# Patient Record
Sex: Male | Born: 1995 | Race: Black or African American | Hispanic: No | Marital: Single | Smoking: Never smoker
Health system: Southern US, Community
[De-identification: ages and names within clinical notes are randomized; demographics above are authoritative.]

## PROBLEM LIST (undated history)

## (undated) DIAGNOSIS — J302 Other seasonal allergic rhinitis: Secondary | ICD-10-CM

## (undated) DIAGNOSIS — Z9109 Other allergy status, other than to drugs and biological substances: Secondary | ICD-10-CM

## (undated) DIAGNOSIS — J45909 Unspecified asthma, uncomplicated: Secondary | ICD-10-CM

## (undated) DIAGNOSIS — Z87442 Personal history of urinary calculi: Secondary | ICD-10-CM

## (undated) DIAGNOSIS — S82899A Other fracture of unspecified lower leg, initial encounter for closed fracture: Secondary | ICD-10-CM

## (undated) DIAGNOSIS — Z91010 Allergy to peanuts: Secondary | ICD-10-CM

## (undated) HISTORY — PX: OTHER SURGICAL HISTORY: SHX169

---
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
## 2014-11-06 ENCOUNTER — Ambulatory Visit (INDEPENDENT_AMBULATORY_CARE_PROVIDER_SITE_OTHER): Payer: Federal, State, Local not specified - PPO | Admitting: Podiatry

## 2014-11-06 ENCOUNTER — Encounter: Payer: Self-pay | Admitting: Podiatry

## 2014-11-06 VITALS — BP 114/65 | HR 65 | Ht 65.0 in | Wt 155.0 lb

## 2014-11-06 DIAGNOSIS — M722 Plantar fascial fibromatosis: Secondary | ICD-10-CM | POA: Insufficient documentation

## 2014-11-06 DIAGNOSIS — L600 Ingrowing nail: Secondary | ICD-10-CM

## 2014-11-06 DIAGNOSIS — M79674 Pain in right toe(s): Secondary | ICD-10-CM

## 2014-11-06 NOTE — Progress Notes (Signed)
SUBJECTIVE: 19 y.o. year old male presents complaining of ingrown nail on right great toe and pain in arch of both feet.  OBJECTIVE: DERMATOLOGIC EXAMINATION: Nails: Ingrown nail right great toe medial border without infection or inflammation.  VASCULAR EXAMINATION OF LOWER LIMBS: Pedal pulses: All pedal pulses are palpable with normal pulsation.  NEUROLOGIC EXAMINATION OF THE LOWER LIMBS: Achilles DTR is present and within normal. Sharp and Dull discriminatory sensations at the plantar ball of hallux is intact bilateral.  MUSCULOSKELETAL EXAMINATION: Positive for Hallux valgus with bunion bilateral. Positive of sagittal plane displacement of the first ray bilateral and fore foot varus bilateral. Tight Achilles tendon bilateral.   ASSESSMENT: Plantar fasciitis bilateral. 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 Ingrown nail right hallux medial border.   PLAN: Reviewed clinical findings and available treatment options. Offending border nail was debrided. Reviewed the need for custom orthotics. Patient will return for custom orthotics.

## 2014-11-06 NOTE — Patient Instructions (Signed)
Seen for painful ingrown nail. Debrided offending border.  Also has pain in arch. May benefit from custom orthotics. Return for custom orthotics.

## 2014-11-18 ENCOUNTER — Encounter: Payer: Self-pay | Admitting: Podiatry

## 2014-11-18 ENCOUNTER — Ambulatory Visit (INDEPENDENT_AMBULATORY_CARE_PROVIDER_SITE_OTHER): Payer: Federal, State, Local not specified - PPO | Admitting: Podiatry

## 2014-11-18 VITALS — BP 120/63 | HR 69 | Ht 65.0 in | Wt 154.0 lb

## 2014-11-18 DIAGNOSIS — M216X1 Other acquired deformities of right foot: Secondary | ICD-10-CM

## 2014-11-18 DIAGNOSIS — M216X2 Other acquired deformities of left foot: Secondary | ICD-10-CM

## 2014-11-18 DIAGNOSIS — M722 Plantar fascial fibromatosis: Secondary | ICD-10-CM

## 2014-11-18 NOTE — Patient Instructions (Signed)
Both feet casted for orthotics. 

## 2014-11-18 NOTE — Progress Notes (Signed)
SUBJECTIVE: 19 y.o. year old male presents to prepare for custom orthotics. Pain is in heel and in arch of both feet.  OBJECTIVE: DERMATOLOGIC EXAMINATION: No abnormal findings. VASCULAR EXAMINATION OF LOWER LIMBS: Pedal pulses: All pedal pulses are palpable with normal pulsation.  NEUROLOGIC EXAMINATION OF THE LOWER LIMBS: Achilles DTR is present and within normal. Sharp and Dull discriminatory sensations at the plantar ball of hallux is intact bilateral.  MUSCULOSKELETAL EXAMINATION: Positive for Hallux valgus with bunion bilateral. Positive of sagittal plane displacement of the first ray bilateral and fore foot varus bilateral. Tight Achilles tendon bilateral.  Radiographic examination reveal HAV with enlarged medial eminence of the first ray bilateral, short first ray left, positive of accessory Navicular bone in right, increased CC lateral deviation angle bilateral, anterior break in CYMA line bilateral.  ASSESSMENT: Plantar fasciitis bilateral. 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 Short first ray left. Excess STJ pronation bilateral.  PLAN: Reviewed clinical findings and available treatment options. Both feet casted for Orthotics.

## 2015-01-20 ENCOUNTER — Ambulatory Visit: Payer: Federal, State, Local not specified - PPO | Admitting: Podiatry

## 2015-05-08 DIAGNOSIS — N200 Calculus of kidney: Secondary | ICD-10-CM | POA: Insufficient documentation

## 2015-06-20 DIAGNOSIS — J309 Allergic rhinitis, unspecified: Secondary | ICD-10-CM

## 2015-06-20 DIAGNOSIS — J4530 Mild persistent asthma, uncomplicated: Secondary | ICD-10-CM

## 2015-07-21 ENCOUNTER — Encounter: Payer: Federal, State, Local not specified - PPO | Admitting: *Deleted

## 2015-07-22 ENCOUNTER — Ambulatory Visit (INDEPENDENT_AMBULATORY_CARE_PROVIDER_SITE_OTHER): Payer: Federal, State, Local not specified - PPO | Admitting: *Deleted

## 2015-07-22 DIAGNOSIS — J309 Allergic rhinitis, unspecified: Secondary | ICD-10-CM

## 2015-07-29 ENCOUNTER — Ambulatory Visit (INDEPENDENT_AMBULATORY_CARE_PROVIDER_SITE_OTHER): Payer: Federal, State, Local not specified - PPO

## 2015-07-29 DIAGNOSIS — J309 Allergic rhinitis, unspecified: Secondary | ICD-10-CM | POA: Diagnosis not present

## 2015-08-12 ENCOUNTER — Ambulatory Visit (INDEPENDENT_AMBULATORY_CARE_PROVIDER_SITE_OTHER): Payer: Federal, State, Local not specified - PPO

## 2015-08-12 DIAGNOSIS — J309 Allergic rhinitis, unspecified: Secondary | ICD-10-CM | POA: Diagnosis not present

## 2015-08-19 ENCOUNTER — Ambulatory Visit (INDEPENDENT_AMBULATORY_CARE_PROVIDER_SITE_OTHER): Payer: Federal, State, Local not specified - PPO | Admitting: *Deleted

## 2015-08-19 DIAGNOSIS — J309 Allergic rhinitis, unspecified: Secondary | ICD-10-CM

## 2015-09-01 ENCOUNTER — Ambulatory Visit (INDEPENDENT_AMBULATORY_CARE_PROVIDER_SITE_OTHER): Payer: Federal, State, Local not specified - PPO | Admitting: *Deleted

## 2015-09-01 DIAGNOSIS — J309 Allergic rhinitis, unspecified: Secondary | ICD-10-CM | POA: Diagnosis not present

## 2015-10-06 ENCOUNTER — Ambulatory Visit (INDEPENDENT_AMBULATORY_CARE_PROVIDER_SITE_OTHER): Payer: Federal, State, Local not specified - PPO | Admitting: *Deleted

## 2015-10-06 DIAGNOSIS — J309 Allergic rhinitis, unspecified: Secondary | ICD-10-CM | POA: Diagnosis not present

## 2015-10-28 ENCOUNTER — Ambulatory Visit (INDEPENDENT_AMBULATORY_CARE_PROVIDER_SITE_OTHER): Payer: Federal, State, Local not specified - PPO

## 2015-10-28 DIAGNOSIS — J309 Allergic rhinitis, unspecified: Secondary | ICD-10-CM

## 2015-11-13 DIAGNOSIS — J301 Allergic rhinitis due to pollen: Secondary | ICD-10-CM | POA: Diagnosis not present

## 2015-11-14 DIAGNOSIS — J3089 Other allergic rhinitis: Secondary | ICD-10-CM | POA: Diagnosis not present

## 2015-11-18 ENCOUNTER — Encounter: Payer: Self-pay | Admitting: Pediatrics

## 2015-11-18 ENCOUNTER — Ambulatory Visit (INDEPENDENT_AMBULATORY_CARE_PROVIDER_SITE_OTHER): Payer: 59 | Admitting: Pediatrics

## 2015-11-18 VITALS — BP 104/70 | HR 84 | Temp 98.9°F | Resp 16 | Ht 64.57 in | Wt 153.2 lb

## 2015-11-18 DIAGNOSIS — J4521 Mild intermittent asthma with (acute) exacerbation: Secondary | ICD-10-CM | POA: Diagnosis not present

## 2015-11-18 DIAGNOSIS — J45901 Unspecified asthma with (acute) exacerbation: Secondary | ICD-10-CM | POA: Insufficient documentation

## 2015-11-18 DIAGNOSIS — J301 Allergic rhinitis due to pollen: Secondary | ICD-10-CM | POA: Diagnosis not present

## 2015-11-18 LAB — PULMONARY FUNCTION TEST

## 2015-11-18 MED ORDER — FLUTICASONE PROPIONATE 50 MCG/ACT NA SUSP: NASAL | Status: DC

## 2015-11-18 MED ORDER — ALBUTEROL SULFATE HFA 108 (90 BASE) MCG/ACT IN AERS: INHALATION_SPRAY | RESPIRATORY_TRACT | Status: DC

## 2015-11-18 MED ORDER — EPINEPHRINE 0.3 MG/0.3ML IJ SOAJ: INTRAMUSCULAR | Status: DC

## 2015-11-18 NOTE — Patient Instructions (Signed)
Continue on his current medications Add prednisone 20 mg twice a day for 3 days, 20 mg on the fourth day, 10 mg on the fifth day Call me if he is not doing well on this treatment plan

## 2015-11-18 NOTE — Progress Notes (Signed)
  [address] [place] [address] Dept: [phone]  FOLLOW UP NOTE  Patient ID: Andrew Jackson, male    DOB: Sep 20, 1996  Age: 20 y.o. MRN: [id number] Date of Office Visit: 11/18/2015  Assessment Chief Complaint: Cough and Breathing Problem  HPI Andrew Jackson presents for evaluation of coughing and some wheezing. He had a cold 2 or 3 weeks ago and developed some coughing and wheezing. He continues to cough. Prior to this time his asthma has been very well controlled He is on allergy injections every 4 weeks. 1 vial contains grass and tree pollens and the other vial contains weeds ,molds and dust mite  Current medications are pro-air 2 puffs every 4 hours if needed, fluticasone 2 sprays per nostril once a day if needed and Zyrtec 10 mg once a day if needed   Drug Allergies:  No Known Allergies  Physical Exam: BP 104/70 mmHg  Pulse 84  Temp(Src) 98.9 F (37.2 C) (Oral)  Resp 16  Ht 5' 4.57" (1.64 m)  Wt 153 lb 3.2 oz (69.491 kg)  BMI 25.84 kg/m2   Physical Exam  Constitutional: He is oriented to person, place, and time. He appears well-developed and well-nourished.  HENT:  Eyes normal. Ears normal. Nose normal. Pharynx normal.  Neck: Neck supple.  Cardiovascular:  S1 and S2 normal no murmurs  Pulmonary/Chest:  Clear to percussion and auscultation  Lymphadenopathy:    He has no cervical adenopathy.  Neurological: He is alert and oriented to person, place, and time.  Psychiatric: He has a normal mood and affect. His behavior is normal. Judgment and thought content normal.  Vitals reviewed.   Diagnostics:  FVC 4.31 L FEV1 3.20 L. Predicted FVC 4.00 L predicted FEV1 3.45 L-the spirometry shows a mild reduction in the FEV1 percent  Assessment and Plan: 1. Asthma with acute exacerbation, mild intermittent   2. Allergic rhinitis due to pollen     Meds ordered this encounter  Medications  . EPINEPHrine (EPIPEN 2-PAK) 0.3 mg/0.3 mL IJ SOAJ injection    Sig: USE  AS DIRECTED FOR SEVERE ALLERGIC REACTION.    Dispense:  2 Device    Refill:  2  . albuterol (PROVENTIL HFA;VENTOLIN HFA) 108 (90 Base) MCG/ACT inhaler    Sig: TWO PUFFS EVERY 4 HOURS IF NEEDED FOR COUGH OR WHEEZE.    Dispense:  8 g    Refill:  2  . fluticasone (FLONASE) 50 MCG/ACT nasal spray    Sig: TWO SPRAYS EACH NOSTRIL ONCE A DAY FOR NASAL CONGESTION OR DRAINAGE.    Dispense:  16 g    Refill:  5    Patient Instructions  Continue on his current medications Add prednisone 20 mg twice a day for 3 days, 20 mg on the fourth day, 10 mg on the fifth day Call me if he is not doing well on this treatment plan    Return in about 1 year (around 11/17/2016).    Thank you for the opportunity to care for this patient.  Please do not hesitate to contact me with questions.  Tonette Bihari, M.D.  [facility] [address] [place], [place] [address] [phone]

## 2015-12-04 ENCOUNTER — Ambulatory Visit: Payer: Federal, State, Local not specified - PPO | Admitting: Pediatrics

## 2015-12-23 ENCOUNTER — Ambulatory Visit (INDEPENDENT_AMBULATORY_CARE_PROVIDER_SITE_OTHER): Payer: 59

## 2015-12-23 DIAGNOSIS — J309 Allergic rhinitis, unspecified: Secondary | ICD-10-CM

## 2016-01-15 ENCOUNTER — Ambulatory Visit (INDEPENDENT_AMBULATORY_CARE_PROVIDER_SITE_OTHER): Payer: 59 | Admitting: *Deleted

## 2016-01-15 DIAGNOSIS — J309 Allergic rhinitis, unspecified: Secondary | ICD-10-CM | POA: Diagnosis not present

## 2016-03-10 ENCOUNTER — Ambulatory Visit (INDEPENDENT_AMBULATORY_CARE_PROVIDER_SITE_OTHER): Payer: 59

## 2016-03-10 DIAGNOSIS — J309 Allergic rhinitis, unspecified: Secondary | ICD-10-CM | POA: Diagnosis not present

## 2016-03-25 ENCOUNTER — Ambulatory Visit (INDEPENDENT_AMBULATORY_CARE_PROVIDER_SITE_OTHER): Payer: 59 | Admitting: *Deleted

## 2016-03-25 DIAGNOSIS — J309 Allergic rhinitis, unspecified: Secondary | ICD-10-CM | POA: Diagnosis not present

## 2016-03-30 ENCOUNTER — Ambulatory Visit (INDEPENDENT_AMBULATORY_CARE_PROVIDER_SITE_OTHER): Payer: 59

## 2016-03-30 DIAGNOSIS — J309 Allergic rhinitis, unspecified: Secondary | ICD-10-CM

## 2016-04-08 ENCOUNTER — Ambulatory Visit (INDEPENDENT_AMBULATORY_CARE_PROVIDER_SITE_OTHER): Payer: 59 | Admitting: *Deleted

## 2016-04-08 DIAGNOSIS — J309 Allergic rhinitis, unspecified: Secondary | ICD-10-CM

## 2016-05-03 ENCOUNTER — Ambulatory Visit (INDEPENDENT_AMBULATORY_CARE_PROVIDER_SITE_OTHER): Payer: 59

## 2016-05-03 DIAGNOSIS — J309 Allergic rhinitis, unspecified: Secondary | ICD-10-CM | POA: Diagnosis not present

## 2016-05-20 ENCOUNTER — Ambulatory Visit (INDEPENDENT_AMBULATORY_CARE_PROVIDER_SITE_OTHER): Payer: 59

## 2016-05-20 DIAGNOSIS — J309 Allergic rhinitis, unspecified: Secondary | ICD-10-CM | POA: Diagnosis not present

## 2016-11-09 NOTE — Addendum Note (Signed)
Addended by: Berna BueWHITAKER, CARRIE L on: 11/09/2016 08:57 AM   Modules accepted: Orders

## 2018-02-14 ENCOUNTER — Ambulatory Visit (INDEPENDENT_AMBULATORY_CARE_PROVIDER_SITE_OTHER): Payer: 59 | Admitting: Pediatrics

## 2018-02-14 ENCOUNTER — Encounter: Payer: Self-pay | Admitting: Pediatrics

## 2018-02-14 VITALS — BP 122/80 | HR 70 | Temp 98.0°F | Resp 16 | Ht 64.25 in | Wt 183.0 lb

## 2018-02-14 DIAGNOSIS — J4530 Mild persistent asthma, uncomplicated: Secondary | ICD-10-CM

## 2018-02-14 DIAGNOSIS — H1010 Acute atopic conjunctivitis, unspecified eye: Secondary | ICD-10-CM

## 2018-02-14 DIAGNOSIS — J301 Allergic rhinitis due to pollen: Secondary | ICD-10-CM | POA: Diagnosis not present

## 2018-02-14 MED ORDER — ALBUTEROL SULFATE HFA 108 (90 BASE) MCG/ACT IN AERS: 2.0000 | INHALATION_SPRAY | RESPIRATORY_TRACT | 2 refills | Status: AC | PRN

## 2018-02-14 MED ORDER — FLUTICASONE PROPIONATE 50 MCG/ACT NA SUSP: NASAL | 5 refills | Status: DC

## 2018-02-14 MED ORDER — MONTELUKAST SODIUM 10 MG PO TABS: ORAL_TABLET | ORAL | 5 refills | Status: DC

## 2018-02-14 NOTE — Progress Notes (Signed)
  [address] [place] [address] Dept: [phone]  FOLLOW UP NOTE  Patient ID: Andrew Jackson, male    DOB: October 07, 1996  Age: 22 y.o. MRN: [id number] Date of Office Visit: 02/14/2018  Assessment  Chief Complaint: Asthma  HPI Andrew Jackson presents for Treatment of coughing, shortness of breath, nasal congestion and sneezing. His last visit was in January 2017. He used to be on allergy injections to grass pollens and tree pollens in one vial and the other vial to weeds and molds and dust mite. He stopped his allergy injections about 1-1/2 years ago  Current medications - cetirizine 10 mg once a day    Drug Allergies:  No Known Allergies  Physical Exam: BP 122/80   Pulse 70   Temp 98 F (36.7 C) (Oral)   Resp 16   Ht 5' 4.25" (1.632 m)   Wt 183 lb (83 kg)   SpO2 97%   BMI 31.17 kg/m    Physical Exam  Constitutional: He is oriented to person, place, and time. He appears well-developed and well-nourished.  HENT:  Eyes normal. Ears normal. Nose moderate swelling of nasal turbinates with clear nasal discharge. Pharynx normal.  Neck: Neck supple.  Cardiovascular:  S1 and S2 normal no murmurs  Pulmonary/Chest:  Clear to percussion and auscultation  Lymphadenopathy:    He has no cervical adenopathy.  Neurological: He is alert and oriented to person, place, and time.  Vitals reviewed.   Diagnostics:  FVC 4.09 L FEV1 3.21 L. Predicted FVC 3.54 L predicted FEV1 2.73 L-the spirometry is in the normal range  Assessment and Plan: 1. Mild persistent asthma without complication   2. Seasonal allergic rhinitis due to pollen   3. Seasonal allergic conjunctivitis     Meds ordered this encounter  Medications  . fluticasone (FLONASE) 50 MCG/ACT nasal spray    Sig: Use 2 sprays per nostril once a day for stuffy nose    Dispense:  16 g    Refill:  5  . montelukast (SINGULAIR) 10 MG tablet    Sig: Take 1 tablet once a day to prevent coughing or wheezing    Dispense:   30 tablet    Refill:  5  . albuterol (PROAIR HFA) 108 (90 Base) MCG/ACT inhaler    Sig: Inhale 2 puffs into the lungs every 4 (four) hours as needed for wheezing or shortness of breath.    Dispense:  1 Inhaler    Refill:  2    Patient Instructions  Zyrtec 10 mg-take 1 tablet once a day for runny nose or itchy eyes Fluticasone 2 sprays per nostril once a day for stuffy nose Opcon-A-one drop 3 times a day if needed for itchy eyes Montelukast 10 mg-take 1 tablet once a day to prevent coughing or wheezing Pro-air 2 puffs every 4 hours if needed for wheezing or coughing spells Prednisone 20 mg twice a day for 3 days, 20 mg on the fourth day, 10 mg on the fifth day to bring your allergic symptoms under control Call us if you're not doing well on this treatment plan   Return in about 6 weeks (around 03/28/2018).    Thank you for the opportunity to care for this patient.  Please do not hesitate to contact me with questions.  Tonette Bihari, M.D.  [facility] [address] [place], [place] [address] [phone]

## 2018-02-14 NOTE — Patient Instructions (Signed)
Zyrtec 10 mg-take 1 tablet once a day for runny nose or itchy eyes Fluticasone 2 sprays per nostril once a day for stuffy nose Opcon-A-one drop 3 times a day if needed for itchy eyes Montelukast 10 mg-take 1 tablet once a day to prevent coughing or wheezing Pro-air 2 puffs every 4 hours if needed for wheezing or coughing spells Prednisone 20 mg twice a day for 3 days, 20 mg on the fourth day, 10 mg on the fifth day to bring your allergic symptoms under control Call us if you're not doing well on this treatment plan

## 2020-08-31 ENCOUNTER — Encounter (HOSPITAL_BASED_OUTPATIENT_CLINIC_OR_DEPARTMENT_OTHER): Payer: Self-pay | Admitting: Emergency Medicine

## 2020-08-31 ENCOUNTER — Emergency Department (HOSPITAL_BASED_OUTPATIENT_CLINIC_OR_DEPARTMENT_OTHER)
Admission: EM | Admit: 2020-08-31 | Discharge: 2020-08-31 | Disposition: A | Discharge status: Home / Self Care | Payer: BC Managed Care – PPO | Attending: Emergency Medicine | Admitting: Emergency Medicine

## 2020-08-31 ENCOUNTER — Other Ambulatory Visit: Payer: Self-pay

## 2020-08-31 ENCOUNTER — Emergency Department (HOSPITAL_BASED_OUTPATIENT_CLINIC_OR_DEPARTMENT_OTHER): Payer: BC Managed Care – PPO

## 2020-08-31 DIAGNOSIS — S46211A Strain of muscle, fascia and tendon of other parts of biceps, right arm, initial encounter: Secondary | ICD-10-CM | POA: Diagnosis not present

## 2020-08-31 DIAGNOSIS — S46219A Strain of muscle, fascia and tendon of other parts of biceps, unspecified arm, initial encounter: Secondary | ICD-10-CM

## 2020-08-31 DIAGNOSIS — Y9367 Activity, basketball: Secondary | ICD-10-CM | POA: Diagnosis not present

## 2020-08-31 DIAGNOSIS — X501XXA Overexertion from prolonged static or awkward postures, initial encounter: Secondary | ICD-10-CM | POA: Diagnosis not present

## 2020-08-31 DIAGNOSIS — Z91010 Allergy to peanuts: Secondary | ICD-10-CM | POA: Insufficient documentation

## 2020-08-31 DIAGNOSIS — S4991XA Unspecified injury of right shoulder and upper arm, initial encounter: Secondary | ICD-10-CM | POA: Diagnosis present

## 2020-08-31 DIAGNOSIS — J45909 Unspecified asthma, uncomplicated: Secondary | ICD-10-CM | POA: Insufficient documentation

## 2020-08-31 HISTORY — DX: Strain of muscle, fascia and tendon of other parts of biceps, unspecified arm, initial encounter: S46.219A

## 2020-08-31 MED ORDER — NAPROXEN 250 MG PO TABS
500.0000 mg | ORAL_TABLET | Freq: Once | ORAL | Status: AC
  Administered 2020-08-31: 500 mg via ORAL
  Filled 2020-08-31: qty 2

## 2020-08-31 NOTE — Discharge Instructions (Addendum)
You were evaluated in the Emergency Department and after careful evaluation, we did not find any emergent condition requiring admission or further testing in the hospital.  Your exam/testing today is overall reassuring.  Your x-ray does not show any broken bones.  Your exam is suspicious for injury or rupture of your distal biceps tendon.  We recommend close follow-up with the orthopedic specialists for further management.  We recommend Tylenol or Motrin at home for discomfort.  We are providing a sling for comfort but please remove the sling and rotate your shoulder multiple times per day to prevent your shoulder from freezing up.  Please return to the Emergency Department if you experience any worsening of your condition.   Thank you for allowing [name] to be a part of your care.

## 2020-08-31 NOTE — ED Triage Notes (Signed)
R arm injury today while playing basketball. C/o pain to the bicep.

## 2020-08-31 NOTE — ED Provider Notes (Signed)
[facility]PT [facility] Emergency Department Provider Note MRN:  [id number]  Arrival date & time: 08/31/20     Chief Complaint   Arm Injury   History of Present Illness   Andrew Jackson is a 24 y.o. year-old male with no pertinent past medical presenting to the ED with chief complaint of arm injury.  Patient explains that he was playing basketball and he was defending the person with the ball, who tried to quickly move past him to his right.  He attempted to grab the ball with his right arm but the player and then a second player kept running through his arm.  He tried with all his my to hold onto the ball and then he felt a sudden severe pain at his right antecubital fossa.  Denies any other injuries.  Pain is worse with motion and palpation to this area.  Review of Systems  A problem-focused ROS was performed. Positive for arm injury.  Patient denies head trauma.  Patient's Health History    Past Medical History:  Diagnosis Date  . Asthma   . Environmental allergies   . Fx ankle LEFT  . Peanut allergy     Past Surgical History:  Procedure Laterality Date  . HARDWARE REMOVAL  10/29/2011   Procedure: HARDWARE REMOVAL;  Surgeon: Kathryne Hitch;  Location: [facility];  Service: Orthopedics;  Laterality: Left;  . SCREWS L ANKLE      Family History  Problem Relation Age of Onset  . Asthma Son     Social History   Socioeconomic History  . Marital status: Single    Spouse name: Not on file  . Number of children: Not on file  . Years of education: Not on file  . Highest education level: Not on file  Occupational History  . Not on file  Tobacco Use  . Smoking status: Never Smoker  . Smokeless tobacco: Never Used  Vaping Use  . Vaping Use: Never used  Substance and Sexual Activity  . Alcohol use: No    Alcohol/week: 0.0 standard drinks  . Drug use: No  . Sexual activity: Yes  Other Topics Concern  . Not on file  Social History Narrative  . Not  on file   Social Determinants of Health   Financial Resource Strain:   . Difficulty of Paying Living Expenses: Not on file  Food Insecurity:   . Worried About Programme researcher, broadcasting/film/video in the Last Year: Not on file  . Ran Out of Food in the Last Year: Not on file  Transportation Needs:   . Lack of Transportation (Medical): Not on file  . Lack of Transportation (Non-Medical): Not on file  Physical Activity:   . Days of Exercise per Week: Not on file  . Minutes of Exercise per Session: Not on file  Stress:   . Feeling of Stress : Not on file  Social Connections:   . Frequency of Communication with Friends and Family: Not on file  . Frequency of Social Gatherings with Friends and Family: Not on file  . Attends Religious Services: Not on file  . Active Member of Clubs or Organizations: Not on file  . Attends Banker Meetings: Not on file  . Marital Status: Not on file  Intimate Partner Violence:   . Fear of Current or Ex-Partner: Not on file  . Emotionally Abused: Not on file  . Physically Abused: Not on file  . Sexually Abused: Not on file  Physical Exam   Vitals:   08/31/20 1633  BP: 114/82  Pulse: 66  Resp: 16  Temp: 98.3 F (36.8 C)  SpO2: 100%    CONSTITUTIONAL: Well-appearing, NAD NEURO:  Alert and oriented x 3, no focal deficits EYES:  eyes equal and reactive ENT/NECK:  no LAD, no JVD CARDIO: Regular rate, well-perfused, normal S1 and S2 PULM:  CTAB no wheezing or rhonchi GI/GU:  normal bowel sounds, non-distended, non-tender MSK/SPINE:  No gross deformities, no edema, tenderness to palpation to the right antecubital fossa, unable to palpate the distal biceps tendon SKIN:  no rash, atraumatic PSYCH:  Appropriate speech and behavior  *Additional and/or pertinent findings included in MDM below  Diagnostic and Interventional Summary    EKG Interpretation  Date/Time:    Ventricular Rate:    PR Interval:    QRS Duration:   QT Interval:    QTC  Calculation:   R Axis:     Text Interpretation:        Labs Reviewed - No data to display  DG Elbow Complete Right  Final Result      Medications  naproxen (NAPROSYN) tablet 500 mg (500 mg Oral Given 08/31/20 1740)     Procedures  /  Critical Care Procedures  ED Course and Medical Decision Making  I have reviewed the triage vital signs, the nursing notes, and pertinent available records from the EMR.  Listed above are laboratory and imaging tests that I personally ordered, reviewed, and interpreted and then considered in my medical decision making (see below for details).  Suspicion for injury or rupture of the right distal biceps tendon on exam.  He does have full range of motion but seems to be having some issues with supination.  X-rays reassuring, neurovascularly intact, referred to orthopedics for further management.  Patient made aware of the importance of follow-up as he may need surgery to regain full function.       Elmer Sow. Pilar Plate, MD [facility] Emergency Medicine [facility] [email or web address]  Final Clinical Impressions(s) / ED Diagnoses     ICD-10-CM   1. Rupture of right distal biceps tendon, initial encounter  P10.258N     ED Discharge Orders    None       Discharge Instructions Discussed with and Provided to Patient:     Discharge Instructions     You were evaluated in the Emergency Department and after careful evaluation, we did not find any emergent condition requiring admission or further testing in the hospital.  Your exam/testing today is overall reassuring.  Your x-ray does not show any broken bones.  Your exam is suspicious for injury or rupture of your distal biceps tendon.  We recommend close follow-up with the orthopedic specialists for further management.  We recommend Tylenol or Motrin at home for discomfort.  We are providing a sling for comfort but please remove the sling and rotate your shoulder multiple times per day  to prevent your shoulder from freezing up.  Please return to the Emergency Department if you experience any worsening of your condition.   Thank you for allowing [name] to be a part of your care.      Sabas Sous, MD 08/31/20 [phone]

## 2020-09-05 ENCOUNTER — Encounter (HOSPITAL_BASED_OUTPATIENT_CLINIC_OR_DEPARTMENT_OTHER): Payer: Self-pay | Admitting: Orthopedic Surgery

## 2020-09-05 ENCOUNTER — Other Ambulatory Visit: Payer: Self-pay

## 2020-09-05 ENCOUNTER — Other Ambulatory Visit (HOSPITAL_COMMUNITY)
Admission: RE | Admit: 2020-09-05 | Discharge: 2020-09-05 | Disposition: A | Discharge status: Home / Self Care | Payer: BC Managed Care – PPO | Source: Ambulatory Visit | Attending: Orthopedic Surgery | Admitting: Orthopedic Surgery

## 2020-09-05 DIAGNOSIS — Z20822 Contact with and (suspected) exposure to covid-19: Secondary | ICD-10-CM | POA: Diagnosis not present

## 2020-09-05 DIAGNOSIS — Z01812 Encounter for preprocedural laboratory examination: Secondary | ICD-10-CM | POA: Diagnosis not present

## 2020-09-05 LAB — SARS CORONAVIRUS 2 (TAT 6-24 HRS): SARS Coronavirus 2: NEGATIVE

## 2020-09-05 NOTE — Progress Notes (Signed)
Spoke w/ via phone for pre-op interview--- PT Lab needs dos---- no              Lab results------ no COVID test ------ done today 09-05-2020 Arrive at ------- 1030 NPO after MN NO Solid Food.  Clear liquids from MN until--- 0930 Medications to take morning of surgery ----- NONE Diabetic medication ----- n/a Patient Special Instructions ----- n/a Pre-Op special Istructions ----- n/a Patient verbalized understanding of instructions that were given at this phone interview. Patient denies shortness of breath, chest pain, fever, cough at this phone interview.

## 2020-09-08 NOTE — H&P (Signed)
HPI:   Andrew Jackson is a 24 year-old seen for an acute injury to his right elbow that occurred two days ago when he was playing basketball and had it hyperextended when reaching out for a ball.  Significant pain and swelling in the right elbow since then.  He was seen at [facility] where x-rays were negative for a fracture.  Past Medical History:  Diagnosis Date  . Environmental allergies   . History of kidney stones   . Mild asthma   . Seasonal allergic rhinitis   . Tear of distal tendon of biceps 08/31/2020   right     Past Surgical History:  Procedure Laterality Date  . HARDWARE REMOVAL  10/29/2011   Procedure: HARDWARE REMOVAL;  Surgeon: Kathryne Hitch;  Location: [facility];  Service: Orthopedics;  Laterality: Left;  . ORIF ANKLE FRACTURE Left 07/2011  . WISDOM TOOTH EXTRACTION  2009     Family History  Problem Relation Age of Onset  . Asthma Son      Current Medications No current facility-administered medications for this encounter.   Marland Kitchen albuterol (PROAIR HFA) 108 (90 Base) MCG/ACT inhaler     Allergies Peanut-containing drug products  Social History  reports that he has never smoked. He has never used smokeless tobacco. He reports that he does not drink alcohol and does not use drugs.    Physical Exam General: Well appearing, in NAD Mental status: Alert and Oriented x3 Lungs: CTA b/l anterior and posterior without crackles or wheeze Heart: RRR, no m/g/r appreciated Abdomen: +BS, soft, nt, nd, no masses, hernias, or organomegaly appreciated Neurological: Speech Clear and organized, no gross focal findings or movement disorder appreciated Musculoskeletal: no gross joint deformity or swelling.  Observed gait normal Extremities:RUE: Examination of his right elbow reveals pain at the distal biceps.  Pain on flexion and supination with weakness.  Mild pain on the ulnar collateral ligament.  Range of motion is 0-130 degrees.  Questionable 1+ valgus  instability with only mild pain.  More of his pain is in the distal biceps region.   Other extremities- Warm and well perfused w/o edema Skin: Warm and dry    Imaging X-rays are reviewed from [facility] that showed no bony or joint abnormalities.   A/P Rupture of the right distal biceps tendon  Will plan for operative repair of the biceps tendon. The risks/benifts/alternatives were discussed with the pt. Who has elected to proceed with the plan.

## 2020-09-09 ENCOUNTER — Encounter (HOSPITAL_BASED_OUTPATIENT_CLINIC_OR_DEPARTMENT_OTHER)
Admission: RE | Disposition: A | Discharge status: Home / Self Care | Payer: Self-pay | Source: Home / Self Care | Attending: Orthopedic Surgery

## 2020-09-09 ENCOUNTER — Ambulatory Visit (HOSPITAL_BASED_OUTPATIENT_CLINIC_OR_DEPARTMENT_OTHER)
Admission: RE | Admit: 2020-09-09 | Discharge: 2020-09-09 | Disposition: A | Discharge status: Home / Self Care | Payer: BC Managed Care – PPO | Attending: Orthopedic Surgery | Admitting: Orthopedic Surgery

## 2020-09-09 ENCOUNTER — Ambulatory Visit (HOSPITAL_BASED_OUTPATIENT_CLINIC_OR_DEPARTMENT_OTHER): Payer: BC Managed Care – PPO | Admitting: Anesthesiology

## 2020-09-09 ENCOUNTER — Encounter (HOSPITAL_BASED_OUTPATIENT_CLINIC_OR_DEPARTMENT_OTHER): Payer: Self-pay | Admitting: Orthopedic Surgery

## 2020-09-09 DIAGNOSIS — S46211A Strain of muscle, fascia and tendon of other parts of biceps, right arm, initial encounter: Secondary | ICD-10-CM | POA: Diagnosis not present

## 2020-09-09 DIAGNOSIS — X501XXA Overexertion from prolonged static or awkward postures, initial encounter: Secondary | ICD-10-CM | POA: Insufficient documentation

## 2020-09-09 DIAGNOSIS — Y9367 Activity, basketball: Secondary | ICD-10-CM | POA: Diagnosis not present

## 2020-09-09 HISTORY — PX: DISTAL BICEPS TENDON REPAIR: SHX1461

## 2020-09-09 HISTORY — DX: Personal history of urinary calculi: Z87.442

## 2020-09-09 HISTORY — DX: Unspecified asthma, uncomplicated: J45.909

## 2020-09-09 HISTORY — DX: Other seasonal allergic rhinitis: J30.2

## 2020-09-09 SURGERY — REPAIR, TENDON, BICEPS, DISTAL
Anesthesia: Regional | Laterality: Right

## 2020-09-09 MED ORDER — OXYCODONE HCL 5 MG/5ML PO SOLN: 5.0000 mg | Freq: Once | ORAL | Status: AC | PRN

## 2020-09-09 MED ORDER — MIDAZOLAM HCL 2 MG/2ML IJ SOLN
INTRAMUSCULAR | Status: AC
  Filled 2020-09-09: qty 2

## 2020-09-09 MED ORDER — METOCLOPRAMIDE HCL 5 MG/ML IJ SOLN: 5.0000 mg | Freq: Three times a day (TID) | INTRAMUSCULAR | Status: DC | PRN

## 2020-09-09 MED ORDER — ONDANSETRON HCL 4 MG PO TABS
4.0000 mg | ORAL_TABLET | Freq: Four times a day (QID) | ORAL | Status: DC | PRN
Start: 2020-09-09 — End: 2020-09-09

## 2020-09-09 MED ORDER — CEFAZOLIN SODIUM-DEXTROSE 2-4 GM/100ML-% IV SOLN
INTRAVENOUS | Status: AC
  Filled 2020-09-09: qty 100

## 2020-09-09 MED ORDER — FENTANYL CITRATE (PF) 100 MCG/2ML IJ SOLN
INTRAMUSCULAR | Status: AC
  Filled 2020-09-09: qty 2

## 2020-09-09 MED ORDER — OXYCODONE HCL 5 MG PO TABS
5.0000 mg | ORAL_TABLET | Freq: Once | ORAL | Status: AC | PRN
  Administered 2020-09-09: 5 mg via ORAL

## 2020-09-09 MED ORDER — PROPOFOL 10 MG/ML IV BOLUS
INTRAVENOUS | Status: DC | PRN
  Administered 2020-09-09: 200 mg via INTRAVENOUS

## 2020-09-09 MED ORDER — ONDANSETRON HCL 4 MG/2ML IJ SOLN
INTRAMUSCULAR | Status: DC | PRN
  Administered 2020-09-09: 4 mg via INTRAVENOUS

## 2020-09-09 MED ORDER — MIDAZOLAM HCL 2 MG/2ML IJ SOLN
2.0000 mg | Freq: Once | INTRAMUSCULAR | Status: AC
  Administered 2020-09-09: 2 mg via INTRAVENOUS

## 2020-09-09 MED ORDER — MORPHINE SULFATE (PF) 4 MG/ML IV SOLN: 0.5000 mg | INTRAVENOUS | Status: DC | PRN

## 2020-09-09 MED ORDER — ONDANSETRON HCL 4 MG/2ML IJ SOLN
INTRAMUSCULAR | Status: AC
  Filled 2020-09-09: qty 2

## 2020-09-09 MED ORDER — ACETAMINOPHEN 500 MG PO TABS
ORAL_TABLET | ORAL | Status: AC
  Filled 2020-09-09: qty 2

## 2020-09-09 MED ORDER — HYDROCODONE-ACETAMINOPHEN 5-325 MG PO TABS: 1.0000 | ORAL_TABLET | ORAL | 0 refills | Status: AC | PRN

## 2020-09-09 MED ORDER — IBUPROFEN 800 MG PO TABS: 800.0000 mg | ORAL_TABLET | Freq: Three times a day (TID) | ORAL | 0 refills | Status: AC | PRN

## 2020-09-09 MED ORDER — PROMETHAZINE HCL 25 MG/ML IJ SOLN: 6.2500 mg | INTRAMUSCULAR | Status: DC | PRN

## 2020-09-09 MED ORDER — CEFAZOLIN SODIUM-DEXTROSE 2-4 GM/100ML-% IV SOLN
2.0000 g | INTRAVENOUS | Status: AC
Start: 2020-09-09 — End: 2020-09-09
  Administered 2020-09-09: 2 g via INTRAVENOUS

## 2020-09-09 MED ORDER — SENNOSIDES-DOCUSATE SODIUM 8.6-50 MG PO TABS
1.0000 | ORAL_TABLET | Freq: Every evening | ORAL | Status: DC | PRN
  Filled 2020-09-09: qty 1

## 2020-09-09 MED ORDER — DEXAMETHASONE SODIUM PHOSPHATE 10 MG/ML IJ SOLN
INTRAMUSCULAR | Status: DC | PRN
  Administered 2020-09-09: 10 mg via INTRAVENOUS

## 2020-09-09 MED ORDER — NAPROXEN 250 MG PO TABS
250.0000 mg | ORAL_TABLET | Freq: Two times a day (BID) | ORAL | Status: DC
  Filled 2020-09-09: qty 1

## 2020-09-09 MED ORDER — ONDANSETRON HCL 4 MG/2ML IJ SOLN: 4.0000 mg | Freq: Four times a day (QID) | INTRAMUSCULAR | Status: DC | PRN

## 2020-09-09 MED ORDER — HYDROCODONE-ACETAMINOPHEN 7.5-325 MG PO TABS: 1.0000 | ORAL_TABLET | ORAL | Status: DC | PRN

## 2020-09-09 MED ORDER — CEFAZOLIN SODIUM-DEXTROSE 2-4 GM/100ML-% IV SOLN: 2.0000 g | Freq: Four times a day (QID) | INTRAVENOUS | Status: DC

## 2020-09-09 MED ORDER — MIDAZOLAM HCL 5 MG/5ML IJ SOLN
INTRAMUSCULAR | Status: DC | PRN
  Administered 2020-09-09: 2 mg via INTRAVENOUS

## 2020-09-09 MED ORDER — LIDOCAINE HCL (PF) 2 % IJ SOLN
INTRAMUSCULAR | Status: AC
  Filled 2020-09-09: qty 5

## 2020-09-09 MED ORDER — KETOROLAC TROMETHAMINE 30 MG/ML IJ SOLN
30.0000 mg | Freq: Once | INTRAMUSCULAR | Status: AC | PRN
  Administered 2020-09-09: 30 mg via INTRAVENOUS

## 2020-09-09 MED ORDER — HYDROCODONE-ACETAMINOPHEN 5-325 MG PO TABS: 1.0000 | ORAL_TABLET | ORAL | Status: DC | PRN

## 2020-09-09 MED ORDER — DEXAMETHASONE SODIUM PHOSPHATE 10 MG/ML IJ SOLN
INTRAMUSCULAR | Status: AC
  Filled 2020-09-09: qty 1

## 2020-09-09 MED ORDER — LIDOCAINE 2% (20 MG/ML) 5 ML SYRINGE
INTRAMUSCULAR | Status: DC | PRN
  Administered 2020-09-09: 40 mg via INTRAVENOUS

## 2020-09-09 MED ORDER — METOCLOPRAMIDE HCL 5 MG PO TABS
5.0000 mg | ORAL_TABLET | Freq: Three times a day (TID) | ORAL | Status: DC | PRN
  Filled 2020-09-09: qty 2

## 2020-09-09 MED ORDER — BISACODYL 10 MG RE SUPP: 10.0000 mg | Freq: Every day | RECTAL | Status: DC | PRN

## 2020-09-09 MED ORDER — ACETAMINOPHEN 500 MG PO TABS
1000.0000 mg | ORAL_TABLET | Freq: Once | ORAL | Status: AC
  Administered 2020-09-09: 1000 mg via ORAL

## 2020-09-09 MED ORDER — LACTATED RINGERS IV SOLN: INTRAVENOUS | Status: DC

## 2020-09-09 MED ORDER — OXYCODONE HCL 5 MG PO TABS
ORAL_TABLET | ORAL | Status: AC
  Filled 2020-09-09: qty 1

## 2020-09-09 MED ORDER — PROPOFOL 10 MG/ML IV BOLUS
INTRAVENOUS | Status: AC
  Filled 2020-09-09: qty 20

## 2020-09-09 MED ORDER — ACETAMINOPHEN 500 MG PO TABS: 1000.0000 mg | ORAL_TABLET | Freq: Once | ORAL | Status: DC

## 2020-09-09 MED ORDER — ROPIVACAINE HCL 5 MG/ML IJ SOLN
INTRAMUSCULAR | Status: DC | PRN
  Administered 2020-09-09: 30 mL via PERINEURAL

## 2020-09-09 MED ORDER — KETOROLAC TROMETHAMINE 30 MG/ML IJ SOLN
INTRAMUSCULAR | Status: AC
  Filled 2020-09-09: qty 1

## 2020-09-09 MED ORDER — FENTANYL CITRATE (PF) 100 MCG/2ML IJ SOLN
25.0000 ug | INTRAMUSCULAR | Status: DC | PRN
  Administered 2020-09-09 (×2): 25 ug via INTRAVENOUS

## 2020-09-09 MED ORDER — POVIDONE-IODINE 10 % EX SWAB: 2.0000 "application " | Freq: Once | CUTANEOUS | Status: DC

## 2020-09-09 MED ORDER — CLONIDINE HCL (ANALGESIA) 100 MCG/ML EP SOLN
EPIDURAL | Status: DC | PRN
  Administered 2020-09-09: 100 ug

## 2020-09-09 MED ORDER — FENTANYL CITRATE (PF) 100 MCG/2ML IJ SOLN
INTRAMUSCULAR | Status: DC | PRN
  Administered 2020-09-09 (×3): 50 ug via INTRAVENOUS

## 2020-09-09 SURGICAL SUPPLY — 47 items
APL PRP STRL LF DISP 70% ISPRP (MISCELLANEOUS) ×1
BLADE SURG 15 STRL LF DISP TIS (BLADE) ×1 IMPLANT
BLADE SURG 15 STRL SS (BLADE) ×3
BNDG CMPR 9X4 STRL LF SNTH (GAUZE/BANDAGES/DRESSINGS) ×1
BNDG ELASTIC 4X5.8 VLCR STR LF (GAUZE/BANDAGES/DRESSINGS) ×3 IMPLANT
BNDG ESMARK 4X9 LF (GAUZE/BANDAGES/DRESSINGS) ×3 IMPLANT
BroadBand Loop ×3 IMPLANT
CHLORAPREP W/TINT 26 (MISCELLANEOUS) ×3 IMPLANT
COVER WAND RF STERILE (DRAPES) ×3 IMPLANT
CUFF TOURN SGL QUICK 24 (TOURNIQUET CUFF) ×3
CUFF TRNQT CYL 24X4X16.5-23 (TOURNIQUET CUFF) ×1 IMPLANT
DRAPE EXTREMITY T 121X128X90 (DISPOSABLE) ×3 IMPLANT
DRAPE IMP U-DRAPE 54X76 (DRAPES) ×3 IMPLANT
DRAPE OEC MINIVIEW 54X84 (DRAPES) ×3 IMPLANT
DRAPE U-SHAPE 47X51 STRL (DRAPES) ×3 IMPLANT
DRSG EMULSION OIL 3X3 NADH (GAUZE/BANDAGES/DRESSINGS) ×3 IMPLANT
DRSG PAD ABDOMINAL 8X10 ST (GAUZE/BANDAGES/DRESSINGS) ×3 IMPLANT
ELECT REM PT RETURN 9FT ADLT (ELECTROSURGICAL) ×3
ELECTRODE REM PT RTRN 9FT ADLT (ELECTROSURGICAL) ×1 IMPLANT
GAUZE SPONGE 4X4 12PLY STRL (GAUZE/BANDAGES/DRESSINGS) ×3 IMPLANT
GLOVE BIO SURGEON STRL SZ7.5 (GLOVE) ×6 IMPLANT
GLOVE BIOGEL PI IND STRL 8 (GLOVE) ×2 IMPLANT
GLOVE BIOGEL PI INDICATOR 8 (GLOVE) ×4
GOWN STRL REUS W/ TWL LRG LVL3 (GOWN DISPOSABLE) ×3 IMPLANT
GOWN STRL REUS W/TWL LRG LVL3 (GOWN DISPOSABLE) ×9
IMPL TOGGLELOC ELBOW SYSTEM (Orthopedic Implant) ×1 IMPLANT
IMPLANT TOGGLELOC ELBOW SYSTEM (Orthopedic Implant) ×3 IMPLANT
KIT TURNOVER CYSTO (KITS) ×3 IMPLANT
NS IRRIG 1000ML POUR BTL (IV SOLUTION) ×3 IMPLANT
PACK BASIN DAY SURGERY FS (CUSTOM PROCEDURE TRAY) ×3 IMPLANT
PACK ORTHO EXTREMITY (CUSTOM PROCEDURE TRAY) ×3 IMPLANT
PAD CAST 4YDX4 CTTN HI CHSV (CAST SUPPLIES) ×2 IMPLANT
PADDING CAST COTTON 4X4 STRL (CAST SUPPLIES) ×6
PENCIL SMOKE EVACUATOR (MISCELLANEOUS) ×3 IMPLANT
SLING ARM FOAM STRAP LRG (SOFTGOODS) ×3 IMPLANT
STOCKINETTE IMPERVIOUS LG (DRAPES) ×3 IMPLANT
SUCTION FRAZIER HANDLE 10FR (MISCELLANEOUS) ×2
SUCTION TUBE FRAZIER 10FR DISP (MISCELLANEOUS) ×1 IMPLANT
SUT BRDBAND LOOP ST-NDL BL (SUTURE) ×3 IMPLANT
SUT ETHILON 3 0 PS 1 (SUTURE) ×3 IMPLANT
SUT VIC AB 0 CT1 27 (SUTURE) ×3
SUT VIC AB 0 CT1 27XBRD ANBCTR (SUTURE) ×1 IMPLANT
SUT VIC AB 2-0 SH 27 (SUTURE) ×3
SUT VIC AB 2-0 SH 27XBRD (SUTURE) ×1 IMPLANT
SYR BULB EAR ULCER 3OZ GRN STR (SYRINGE) ×3 IMPLANT
TOWEL OR 17X26 10 PK STRL BLUE (TOWEL DISPOSABLE) ×3 IMPLANT
UNDERPAD 30X36 HEAVY ABSORB (UNDERPADS AND DIAPERS) ×3 IMPLANT

## 2020-09-09 NOTE — Anesthesia Procedure Notes (Signed)
Anesthesia Regional Block: Supraclavicular block   Pre-Anesthetic Checklist: ,, timeout performed, Correct Patient, Correct Site, Correct Laterality, Correct Procedure, Correct Position, site marked, Risks and benefits discussed,  Surgical consent,  Pre-op evaluation,  At surgeon's request and post-op pain management  Laterality: Right  Prep: chloraprep       Needles:  Injection technique: Single-shot  Needle Type: Echogenic Stimulator Needle     Needle Length: 10cm  Needle Gauge: 20     Additional Needles:   Procedures:,,,, ultrasound used (permanent image in chart),,,,  Narrative:  Start time: 09/09/2020 11:20 AM End time: 09/09/2020 11:30 AM Injection made incrementally with aspirations every 5 mL.  Performed by: Personally  Anesthesiologist: Leonides Grills, MD  Additional Notes: Functioning IV was confirmed and monitors were applied.  A timeout was performed. Sterile prep, hand hygiene and sterile gloves were used. A 20ga BBraun echogenic stimulator needle was used. Negative aspiration and negative test dose prior to incremental administration of local anesthetic. The patient tolerated the procedure well.  Ultrasound guidance: relevent anatomy identified, needle position confirmed, local anesthetic spread visualized around nerve(s), vascular puncture avoided.  Image printed for medical record.

## 2020-09-09 NOTE — Interval H&P Note (Signed)
History and Physical Interval Note:  09/09/2020 9:42 AM  Andrew Jackson  has presented today for surgery, with the diagnosis of RIGHT DISTAL BICEPS TEAR.  The various methods of treatment have been discussed with the patient and family. After consideration of risks, benefits and other options for treatment, the patient has consented to  Procedure(s): RIGHT DISTAL BICEPS TENDON REPAIR (Right) as a surgical intervention.  The patient's history has been reviewed, patient examined, no change in status, stable for surgery.  I have reviewed the patient's chart and labs.  Questions were answered to the patient's satisfaction.     Sheral Apley

## 2020-09-09 NOTE — Op Note (Signed)
09/09/2020  12:36 PM  PATIENT:  Andrew Jackson    PRE-OPERATIVE DIAGNOSIS:  RIGHT DISTAL BICEPS TEAR  POST-OPERATIVE DIAGNOSIS:  Same  PROCEDURE:  RIGHT DISTAL BICEPS TENDON REPAIR  SURGEON:  Sheral Apley, MD  ASSISTANT: Daun Peacock, PA-C, he was present and scrubbed throughout the case, critical for completion in a timely fashion, and for retraction, instrumentation, and closure.   ANESTHESIA:   gen  PREOPERATIVE INDICATIONS:  Andrew Jackson is a  25 y.o. male with a diagnosis of RIGHT DISTAL BICEPS TEAR who failed conservative measures and elected for surgical management.    The risks benefits and alternatives were discussed with the patient preoperatively including but not limited to the risks of infection, bleeding, nerve injury, cardiopulmonary complications, the need for revision surgery, among others, and the patient was willing to proceed.  OPERATIVE IMPLANTS: biomet ziploop   OPERATIVE FINDINGS: biceps rupture  BLOOD LOSS: min  COMPLICATIONS: none  TOURNIQUET TIME:  OPERATIVE PROCEDURE:  Patient was identified in the preoperative holding area and site was marked by me He was transported to the operating theater and placed on the table in supine position taking care to pad all bony prominences. After a preincinduction time out anesthesia was induced. The right upper extremity was prepped and draped in normal sterile fashion and a pre-incision timeout was performed. He received ancef for preoperative antibiotics.   Sterile tourniquet was applied and inflated.  250 mmHg.  I made a longitudinal incision over the proximal radius kept the arm supinated to protect his PIN nerve and radial nerve.  I dissected bluntly protecting all neurovascular structures.  Identified the radial tuberosity it had been completely avulsed of all insertion of the biceps.  Next I tracked proximally and identified the stump of the biceps tendon I was able to deliver this into the  wound and placed a whipstitch across this.  Next I tied it to a zip loop button I used fluoroscopic guidance 3+ x-rays to place a guidepin at the bicipital tuberosity and then over reamed the near cortex.  I delivered the zip loop zip loop through this and flipped it on the far side of the radius.  I then tensioned the zip loop delivering the biceps tendon into the unicortical hole in the radial tuberosity and was happy with the seating it was stable through near complete range of motion.  I thoroughly irrigated his incision and closed this incision placed him in a sterile dressing and a sling.  He was awoken and taken the PACU in stable condition  POST OPERATIVE PLAN: Mobilize for DVT prophylaxis sling full-time

## 2020-09-09 NOTE — Discharge Instructions (Signed)
Maintain sling until follow up.  Diet: As you were doing prior to hospitalization   Dressing:  Keep dressings on and dry.  You may remove dressings in 3 days and shower over incisions.  No Bath / submerging incisions.  Cover with clean Band-Aid.  Activity:  Increase activity slowly as tolerated, but follow the weight bearing instructions below.  The rules on driving is that you can not be taking narcotics while you drive, and you must feel in control of the vehicle.    Weight Bearing:  Do not lift or bear weight with affected arm.  You may straighten and bend arm at the elbow.  Pain:  For severe pain, you may increase breakthrough pain medication (Norco) for the first few days post op to 2 tablets every 4 hours.  Stop this medication as soon as you are able.  Constipation: Narcotic pain medications cause constipation.  Reduce use or stop taking if you become constipated.  Drink plenty of fluids (prune juice may be helpful) and high fiber foods.  You may use a stool softener such as -  Colace (over the counter) 100 mg by mouth twice a day  And/or Miralax (over the counter) for constipation as needed.    Itching:  If you experience itching with your medications, try taking only a single pain pill, or even half a pain pill at a time.  You can also use benadryl over the counter for itching or also to help with sleep.   Precautions:  If you experience chest pain or shortness of breath - call 911 immediately for transfer to the hospital emergency department!!  If you develop a fever greater that 101 F, purulent drainage from wound, increased redness or drainage from wound, or calf pain -- Call the office at [phone]                                                 Follow- Up Appointment:  Please call for an appointment to be seen in 2 weeks [place] - [phone]     Post Anesthesia Home Care Instructions  Activity: Get plenty of rest for the remainder of the day. A responsible  individual must stay with you for 24 hours following the procedure.  For the next 24 hours, DO NOT: -Drive a car -Advertising copywriter -Drink alcoholic beverages -Take any medication unless instructed by your physician -Make any legal decisions or sign important papers.  Meals: Start with liquid foods such as gelatin or soup. Progress to regular foods as tolerated. Avoid greasy, spicy, heavy foods. If nausea and/or vomiting occur, drink only clear liquids until the nausea and/or vomiting subsides. Call your physician if vomiting continues.  Special Instructions/Symptoms: Your throat may feel dry or sore from the anesthesia or the breathing tube placed in your throat during surgery. If this causes discomfort, gargle with warm salt water. The discomfort should disappear within 24 hours.  If you had a scopolamine patch placed behind your ear for the management of post- operative nausea and/or vomiting:  1. The medication in the patch is effective for 72 hours, after which it should be removed.  Wrap patch in a tissue and discard in the trash. Wash hands thoroughly with soap and water. 2. You may remove the patch earlier than 72 hours if you experience unpleasant side effects which may include dry  mouth, dizziness or visual disturbances. 3. Avoid touching the patch. Wash your hands with soap and water after contact with the patch.     NO IBUPROFEN PRODUCTS (MOTRIN, ADVIL) OR ALEVE UNTIL 8:15 PM TODAY.

## 2020-09-09 NOTE — Anesthesia Preprocedure Evaluation (Addendum)
Anesthesia Evaluation  Patient identified by MRN, date of birth, ID band Patient awake    Reviewed: Allergy & Precautions, NPO status , Patient's Chart, lab work & pertinent test results  Airway Mallampati: II  TM Distance: >3 FB Neck ROM: Full    Dental  (+) Chipped,    Pulmonary asthma ,    Pulmonary exam normal breath sounds clear to auscultation       Cardiovascular negative cardio ROS Normal cardiovascular exam Rhythm:Regular Rate:Normal     Neuro/Psych negative neurological ROS  negative psych ROS   GI/Hepatic negative GI ROS, Neg liver ROS,   Endo/Other  negative endocrine ROS  Renal/GU negative Renal ROS     Musculoskeletal negative musculoskeletal ROS (+)   Abdominal   Peds  Hematology negative hematology ROS (+)   Anesthesia Other Findings RIGHT DISTAL BICEPS TEAR  Reproductive/Obstetrics                            Anesthesia Physical Anesthesia Plan  ASA: I  Anesthesia Plan: General and Regional   Post-op Pain Management: GA combined w/ Regional for post-op pain   Induction: Intravenous  PONV Risk Score and Plan: 2 and Ondansetron, Dexamethasone, Midazolam and Treatment may vary due to age or medical condition  Airway Management Planned: LMA  Additional Equipment:   Intra-op Plan:   Post-operative Plan: Extubation in OR  Informed Consent: I have reviewed the patients History and Physical, chart, labs and discussed the procedure including the risks, benefits and alternatives for the proposed anesthesia with the patient or authorized representative who has indicated his/her understanding and acceptance.     Dental advisory given  Plan Discussed with: CRNA  Anesthesia Plan Comments:        Anesthesia Quick Evaluation

## 2020-09-09 NOTE — Anesthesia Procedure Notes (Signed)
Procedure Name: LMA Insertion Date/Time: 09/09/2020 11:45 AM Performed by: Bishop Limbo, CRNA Pre-anesthesia Checklist: Patient identified, Emergency Drugs available, Suction available and Patient being monitored Patient Re-evaluated:Patient Re-evaluated prior to induction Oxygen Delivery Method: Circle System Utilized Preoxygenation: Pre-oxygenation with 100% oxygen Induction Type: IV induction Ventilation: Mask ventilation without difficulty LMA: LMA inserted LMA Size: 5.0 Number of attempts: 1 Placement Confirmation: positive ETCO2 Tube secured with: Tape Dental Injury: Teeth and Oropharynx as per pre-operative assessment

## 2020-09-09 NOTE — Interval H&P Note (Signed)
History and Physical Interval Note:  09/09/2020 10:56 AM  Andrew Jackson  has presented today for surgery, with the diagnosis of RIGHT DISTAL BICEPS TEAR.  The various methods of treatment have been discussed with the patient and family. After consideration of risks, benefits and other options for treatment, the patient has consented to  Procedure(s): RIGHT DISTAL BICEPS TENDON REPAIR (Right) as a surgical intervention.  The patient's history has been reviewed, patient examined, no change in status, stable for surgery.  I have reviewed the patient's chart and labs.  Questions were answered to the patient's satisfaction.     Sheral Apley

## 2020-09-09 NOTE — Anesthesia Postprocedure Evaluation (Signed)
Anesthesia Post Note  Patient: Andrew Jackson  Procedure(s) Performed: RIGHT DISTAL BICEPS TENDON REPAIR (Right )     Patient location during evaluation: PACU Anesthesia Type: Regional and General Level of consciousness: awake and alert Pain management: pain level controlled Vital Signs Assessment: post-procedure vital signs reviewed and stable Respiratory status: spontaneous breathing, nonlabored ventilation, respiratory function stable and patient connected to nasal cannula oxygen Cardiovascular status: blood pressure returned to baseline and stable Postop Assessment: no apparent nausea or vomiting Anesthetic complications: no   No complications documented.  Last Vitals:  Vitals:   09/09/20 1400 09/09/20 1430  BP: 122/72   Pulse: 66 76  Resp: (!) 26 20  Temp:    SpO2: 100% 99%    Last Pain:  Vitals:   09/09/20 1430  TempSrc:   PainSc: 1                  Ivannah Zody P Zackery Brine

## 2020-09-09 NOTE — Progress Notes (Signed)
Assisted Dr. Ellender with right, ultrasound guided, supraclavicular block. Side rails up, monitors on throughout procedure. See vital signs in flow sheet. Tolerated Procedure well. 

## 2020-09-09 NOTE — Transfer of Care (Signed)
Immediate Anesthesia Transfer of Care Note  Patient: Andrew Jackson  Procedure(s) Performed: RIGHT DISTAL BICEPS TENDON REPAIR (Right )  Patient Location: PACU  Anesthesia Type:General and Regional  Level of Consciousness: drowsy and patient cooperative  Airway & Oxygen Therapy: Patient Spontanous Breathing and Patient connected to nasal cannula oxygen  Post-op Assessment: Report given to RN and Post -op Vital signs reviewed and stable  Post vital signs: Reviewed and stable  Last Vitals:  Vitals Value Taken Time  BP    Temp    Pulse    Resp    SpO2      Last Pain:  Vitals:   09/09/20 1120  TempSrc:   PainSc: 0-No pain      Patients Stated Pain Goal: 4 (09/09/20 0959)  Complications: No complications documented.

## 2020-09-16 ENCOUNTER — Encounter (HOSPITAL_BASED_OUTPATIENT_CLINIC_OR_DEPARTMENT_OTHER): Payer: Self-pay | Admitting: Orthopedic Surgery

## 2022-07-23 IMAGING — CR DG ELBOW COMPLETE 3+V*R*
4 series · 4 of 4 positions shown · non-contrast
Comparison: None.

CLINICAL DATA: Basketball injury, right elbow hyperextension, and
limited range of motion

EXAM:
RIGHT ELBOW - COMPLETE 3+ VIEW

[x elbow joint ap right]
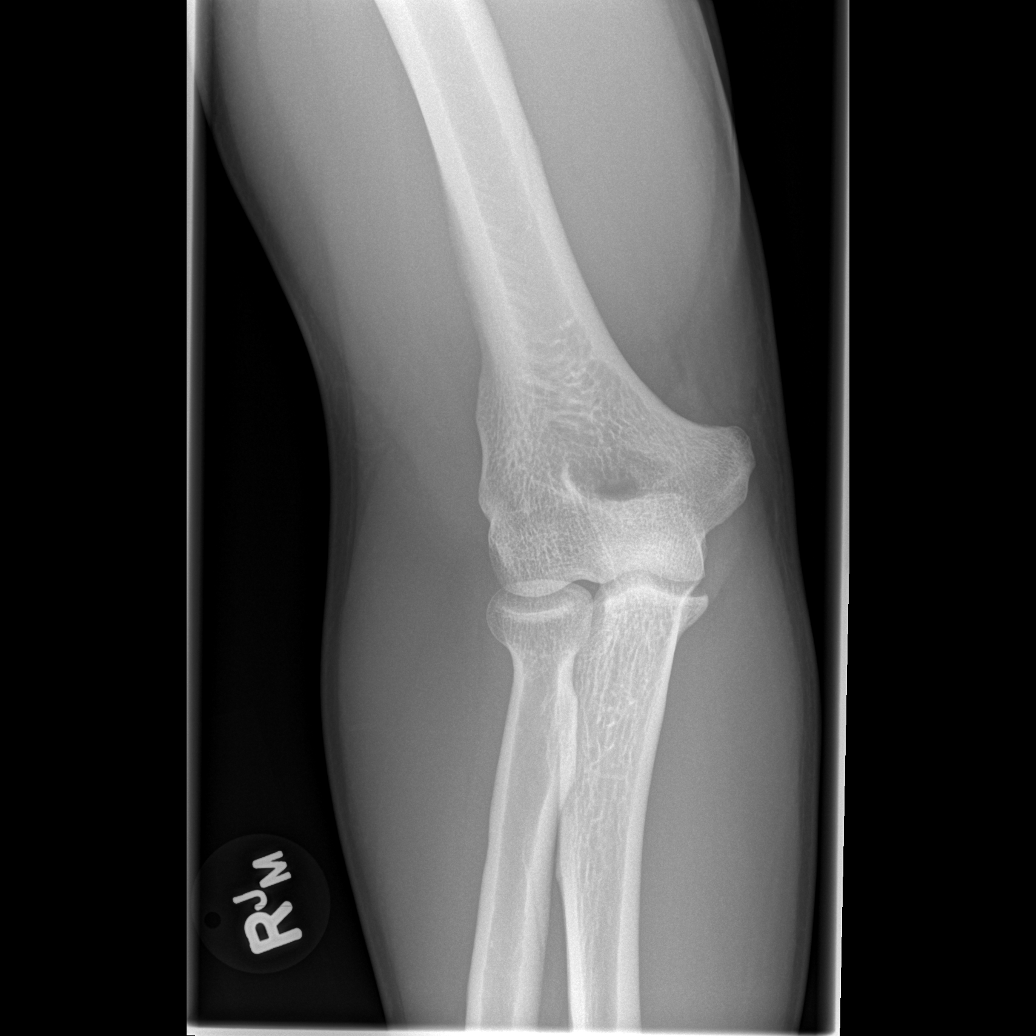

[x elbow joint obl. right (1 of 2)]
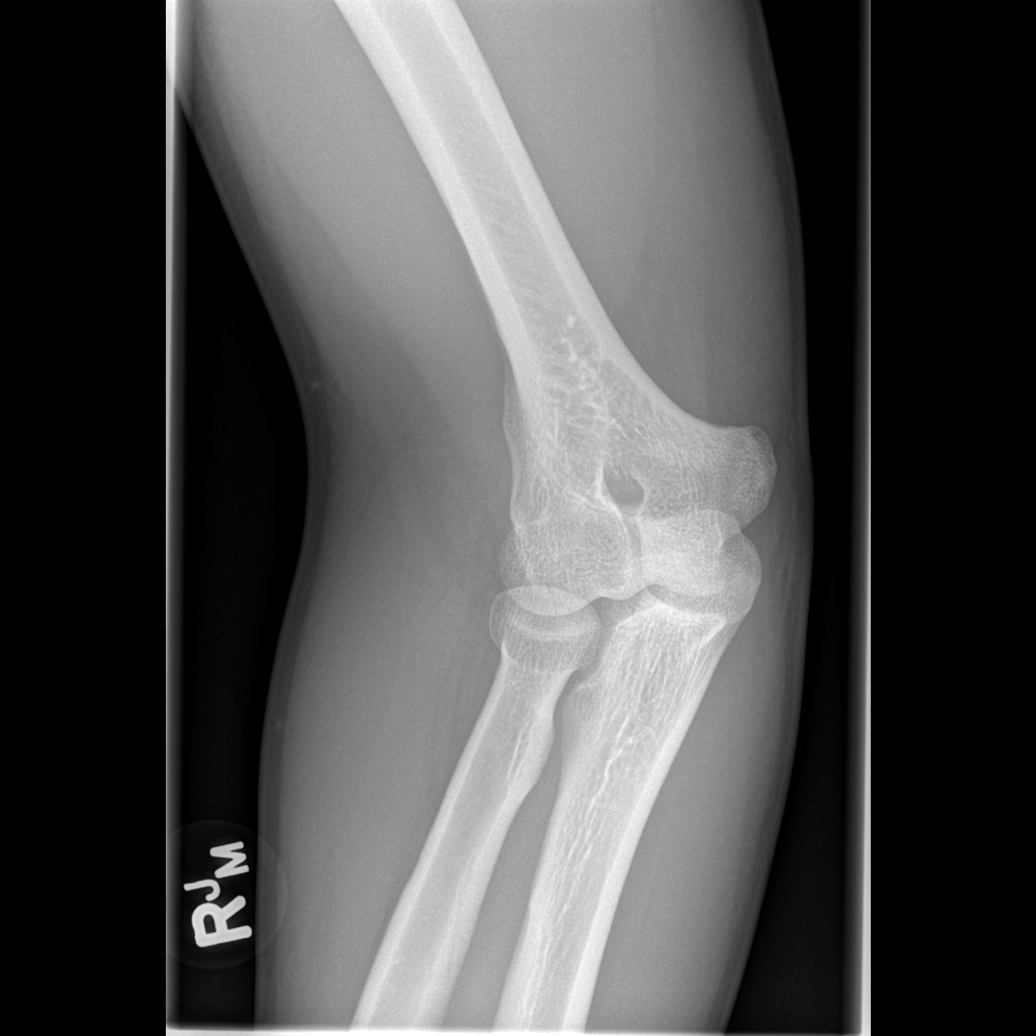

[x elbow joint obl. right (2 of 2)]
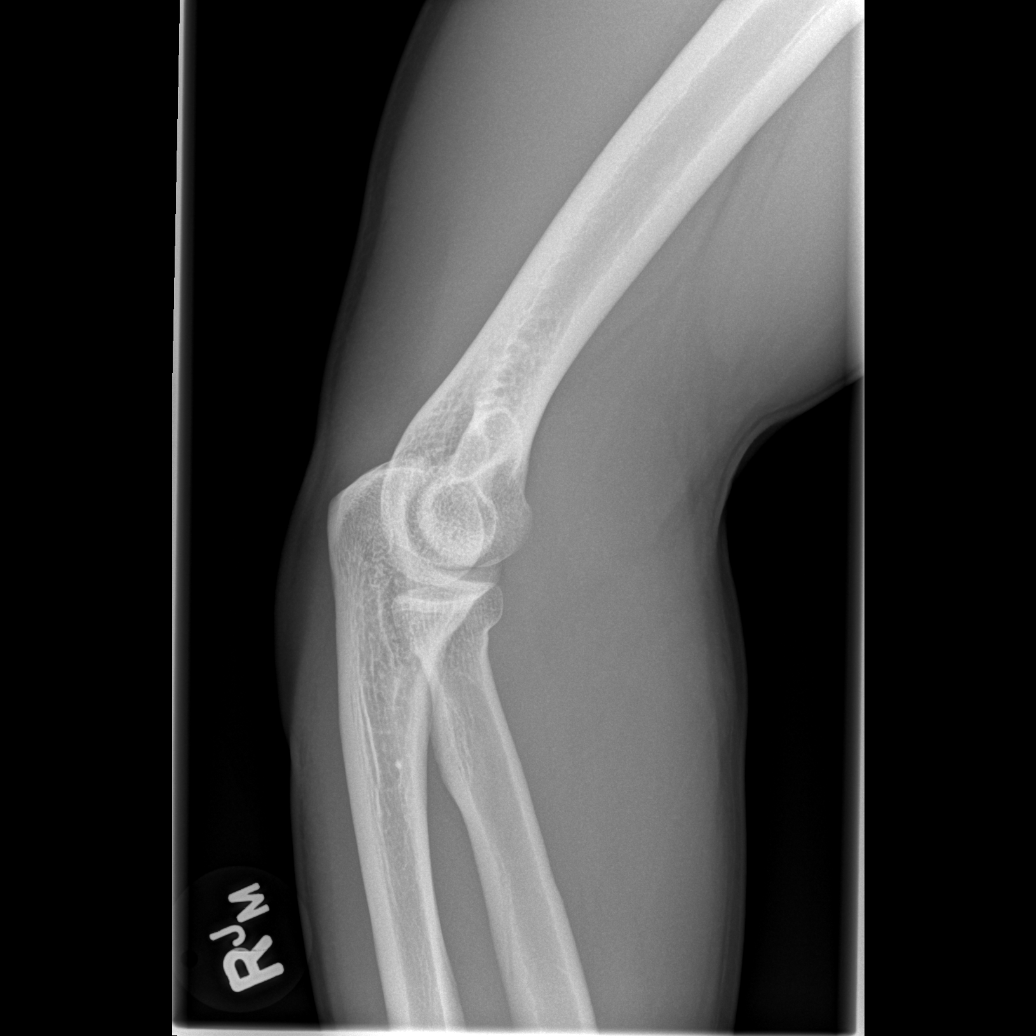

[x elbow joint lat right]
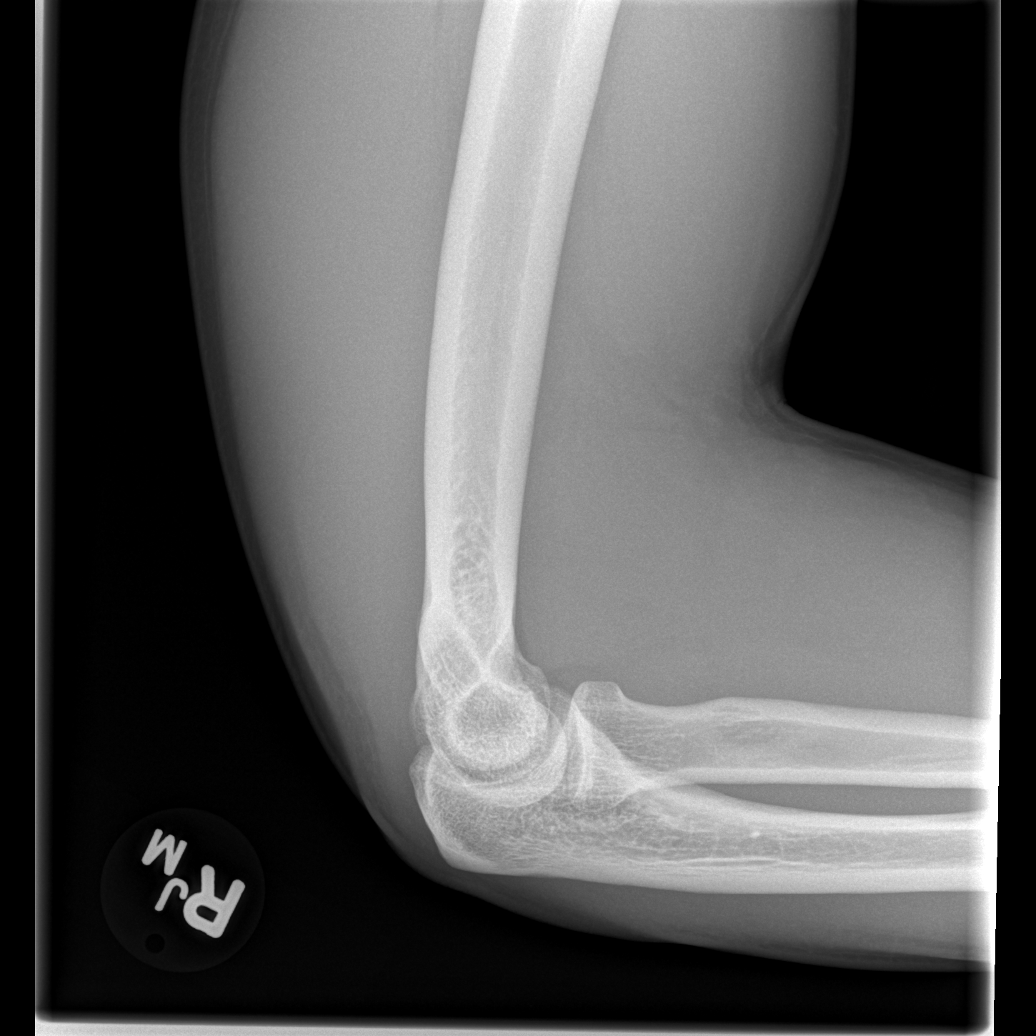

[4 of 4 positions shown; findings below may reference images not displayed]

FINDINGS: Frontal, bilateral oblique, lateral views of the right elbow are
obtained. The patient is unable to incompletely extend the elbow. No
fracture, subluxation, or dislocation. Joint spaces are well
preserved. No joint effusion. Soft tissues are unremarkable.
IMPRESSION: 1. No acute bony abnormality.
